# Patient Record
Sex: Male | Born: 1980 | Race: Black or African American | Hispanic: No | Marital: Single | Smoking: Current every day smoker
Health system: Southern US, Community
[De-identification: ages and names within clinical notes are randomized; demographics above are authoritative.]

## PROBLEM LIST (undated history)

## (undated) DIAGNOSIS — R569 Unspecified convulsions: Secondary | ICD-10-CM

## (undated) DIAGNOSIS — I10 Essential (primary) hypertension: Secondary | ICD-10-CM

## (undated) HISTORY — PX: OTHER SURGICAL HISTORY: SHX169

---

## 2004-01-20 ENCOUNTER — Ambulatory Visit: Payer: Self-pay | Admitting: Physician Assistant

## 2004-04-01 ENCOUNTER — Ambulatory Visit: Payer: Self-pay | Admitting: Physician Assistant

## 2012-09-22 ENCOUNTER — Emergency Department: Payer: Self-pay | Admitting: Emergency Medicine

## 2012-09-22 LAB — BASIC METABOLIC PANEL
Anion Gap: 11 (ref 7–16)
BUN: 13 mg/dL (ref 7–18)
Calcium, Total: 9.2 mg/dL (ref 8.5–10.1)
Chloride: 110 mmol/L — ABNORMAL HIGH (ref 98–107)
Co2: 21 mmol/L (ref 21–32)
Creatinine: 1.15 mg/dL (ref 0.60–1.30)
EGFR (African American): >60
EGFR (Non-African Amer.): >60
Glucose: 91 mg/dL (ref 65–99)
Osmolality: 283 (ref 275–301)
Potassium: 3.9 mmol/L (ref 3.5–5.1)
Sodium: 142 mmol/L (ref 136–145)

## 2012-09-22 LAB — CBC
HCT: 49.1 % (ref 40.0–52.0)
HGB: 16.4 g/dL (ref 13.0–18.0)
MCH: 29.4 pg (ref 26.0–34.0)
MCHC: 33.3 g/dL (ref 32.0–36.0)
MCV: 88 fL (ref 80–100)
Platelet: 322 10*3/uL (ref 150–440)
WBC: 8 10*3/uL (ref 3.8–10.6)

## 2012-09-22 LAB — CK TOTAL AND CKMB (NOT AT ARMC)
CK, Total: 425 U/L — ABNORMAL HIGH (ref 35–232)
CK-MB: 1.8 ng/mL (ref 0.5–3.6)

## 2012-09-22 LAB — TROPONIN I: Troponin-I: <0.02 ng/mL

## 2015-06-16 ENCOUNTER — Emergency Department: Payer: Medicare Other

## 2015-06-16 ENCOUNTER — Encounter: Payer: Self-pay | Admitting: *Deleted

## 2015-06-16 ENCOUNTER — Ambulatory Visit
Admission: EM | Admit: 2015-06-16 | Discharge: 2015-06-16 | Discharge status: Absent without leave | Payer: Medicaid Other

## 2015-06-16 ENCOUNTER — Emergency Department
Admission: EM | Admit: 2015-06-16 | Discharge: 2015-06-16 | Disposition: A | Discharge status: Home / Self Care | Payer: Medicare Other | Attending: Emergency Medicine | Admitting: Emergency Medicine

## 2015-06-16 DIAGNOSIS — Z955 Presence of coronary angioplasty implant and graft: Secondary | ICD-10-CM | POA: Insufficient documentation

## 2015-06-16 DIAGNOSIS — I10 Essential (primary) hypertension: Secondary | ICD-10-CM | POA: Insufficient documentation

## 2015-06-16 DIAGNOSIS — R569 Unspecified convulsions: Secondary | ICD-10-CM | POA: Diagnosis not present

## 2015-06-16 DIAGNOSIS — Y929 Unspecified place or not applicable: Secondary | ICD-10-CM | POA: Insufficient documentation

## 2015-06-16 DIAGNOSIS — Z23 Encounter for immunization: Secondary | ICD-10-CM | POA: Diagnosis not present

## 2015-06-16 DIAGNOSIS — W28XXXA Contact with powered lawn mower, initial encounter: Secondary | ICD-10-CM | POA: Insufficient documentation

## 2015-06-16 DIAGNOSIS — F17200 Nicotine dependence, unspecified, uncomplicated: Secondary | ICD-10-CM | POA: Diagnosis not present

## 2015-06-16 DIAGNOSIS — S61219A Laceration without foreign body of unspecified finger without damage to nail, initial encounter: Secondary | ICD-10-CM

## 2015-06-16 DIAGNOSIS — S62639B Displaced fracture of distal phalanx of unspecified finger, initial encounter for open fracture: Secondary | ICD-10-CM

## 2015-06-16 DIAGNOSIS — Y9389 Activity, other specified: Secondary | ICD-10-CM | POA: Diagnosis not present

## 2015-06-16 DIAGNOSIS — S61215A Laceration without foreign body of left ring finger without damage to nail, initial encounter: Secondary | ICD-10-CM | POA: Diagnosis present

## 2015-06-16 DIAGNOSIS — Y999 Unspecified external cause status: Secondary | ICD-10-CM | POA: Diagnosis not present

## 2015-06-16 DIAGNOSIS — S62635B Displaced fracture of distal phalanx of left ring finger, initial encounter for open fracture: Secondary | ICD-10-CM | POA: Insufficient documentation

## 2015-06-16 HISTORY — DX: Essential (primary) hypertension: I10

## 2015-06-16 HISTORY — DX: Unspecified convulsions: R56.9

## 2015-06-16 MED ORDER — BUPIVACAINE HCL 0.5 % IJ SOLN
50.0000 mL | Freq: Once | INTRAMUSCULAR | Status: DC
  Filled 2015-06-16: qty 50

## 2015-06-16 MED ORDER — TETANUS-DIPHTH-ACELL PERTUSSIS 5-2.5-18.5 LF-MCG/0.5 IM SUSP
0.5000 mL | Freq: Once | INTRAMUSCULAR | Status: AC
  Administered 2015-06-16: 0.5 mL via INTRAMUSCULAR
  Filled 2015-06-16: qty 0.5

## 2015-06-16 MED ORDER — OXYCODONE-ACETAMINOPHEN 5-325 MG PO TABS: 1.0000 | ORAL_TABLET | Freq: Four times a day (QID) | ORAL | Status: DC | PRN

## 2015-06-16 MED ORDER — LIDOCAINE HCL (PF) 1 % IJ SOLN
INTRAMUSCULAR | Status: AC
  Filled 2015-06-16: qty 5

## 2015-06-16 MED ORDER — BUPIVACAINE HCL (PF) 0.5 % IJ SOLN
INTRAMUSCULAR | Status: AC
  Filled 2015-06-16: qty 30

## 2015-06-16 MED ORDER — SULFAMETHOXAZOLE-TRIMETHOPRIM 800-160 MG PO TABS: 1.0000 | ORAL_TABLET | Freq: Two times a day (BID) | ORAL | Status: DC

## 2015-06-16 MED ORDER — OXYCODONE-ACETAMINOPHEN 5-325 MG PO TABS
1.0000 | ORAL_TABLET | Freq: Once | ORAL | Status: AC
  Administered 2015-06-16: 1 via ORAL
  Filled 2015-06-16: qty 1

## 2015-06-16 MED ORDER — CEPHALEXIN 500 MG PO CAPS: 500.0000 mg | ORAL_CAPSULE | Freq: Two times a day (BID) | ORAL | Status: AC

## 2015-06-16 MED ORDER — BACITRACIN ZINC 500 UNIT/GM EX OINT
1.0000 "application " | TOPICAL_OINTMENT | Freq: Two times a day (BID) | CUTANEOUS | Status: DC
  Administered 2015-06-16: 1 via TOPICAL
  Filled 2015-06-16: qty 0.9

## 2015-06-16 MED ORDER — LIDOCAINE HCL (PF) 1 % IJ SOLN
5.0000 mL | Freq: Once | INTRAMUSCULAR | Status: DC
Start: 2015-06-16 — End: 2015-06-16
  Filled 2015-06-16: qty 5

## 2015-06-16 NOTE — ED Provider Notes (Signed)
[facility] Emergency Department Provider Note ____________________________________________  Time seen: Approximately 3:40 PM  I have reviewed the triage vital signs and the nursing notes.   HISTORY  Chief Complaint Laceration   HPI Jacob Bell. is a 35 y.o. male who presents to the emergency department for evaluation of a hand injury. He states his hand slipped under his lawn mower and cut his 3rd and 4th fingertip just prior to arrival. Bleeding well controlled at this time. Unsure of last tetanus shot.    Past Medical History  Diagnosis Date  . Seizures (HCC)   . Hypertension     There are no active problems to display for this patient.   Past Surgical History  Procedure Laterality Date  . Stent coronary      Current Outpatient Rx  Name  Route  Sig  Dispense  Refill  . cephALEXin (KEFLEX) 500 MG capsule   Oral   Take 1 capsule (500 mg total) by mouth 2 (two) times daily.   40 capsule   0   . oxyCODONE-acetaminophen (ROXICET) 5-325 MG tablet   Oral   Take 1 tablet by mouth every 6 (six) hours as needed.   20 tablet   0   . sulfamethoxazole-trimethoprim (BACTRIM DS,SEPTRA DS) 800-160 MG tablet   Oral   Take 1 tablet by mouth 2 (two) times daily.   20 tablet   0     Allergies Review of patient's allergies indicates no known allergies.  History reviewed. No pertinent family history.  Social History Social History  Substance Use Topics  . Smoking status: Current Every Day Smoker  . Smokeless tobacco: None  . Alcohol Use: No    Review of Systems  Constitutional: No recent illness Musculoskeletal: Positive for pain in the 3rd and 4th digits of left hand. Skin: Positive for laceration to 4th finger and abrasion on 3rd. Neurological: Negative for focal weakness or numbness. ____________________________________________   PHYSICAL EXAM:  VITAL SIGNS: ED Triage Vitals  Enc Vitals Group     BP 06/16/15 1454 137/88  mmHg     Pulse Rate 06/16/15 1454 79     Resp 06/16/15 1454 15     Temp 06/16/15 1454 97.7 F (36.5 C)     Temp Source 06/16/15 1454 Oral     SpO2 06/16/15 1454 97 %     Weight 06/16/15 1454 230 lb (104.327 kg)     Height 06/16/15 1454  (1.88 m)     Head Cir --      Peak Flow --      Pain Score 06/16/15 1452 10     Pain Loc --      Pain Edu? --      Excl. in GC? --     Constitutional: Alert and oriented. Well appearing and in no acute distress. Head: Atraumatic. Mouth/Throat: Mucous membranes are moist.   Cardiovascular: Normal rate, regular rhythm.  Good peripheral circulation. Respiratory: Normal respiratory effort.  No retractions. Musculoskeletal: Active ROM of all fingers on the left hand. Movement of DIP of 4th digit of left hand against resistance 5+.   Neurologic:  Normal speech and language. No gross focal neurologic deficits are appreciated. Speech is normal. No gait instability. 2 point discrimination of left hand, 4th digit is intact.  Skin:  Gaping laceration to the fingertip of the 4th digit on the left hand; abrasion to the 3rd digit of the left hand; Negative for petechiae.  Psychiatric: Mood and  affect are normal. Speech and behavior are normal.  ____________________________________________   LABS (all labs ordered are listed, but only abnormal results are displayed)  Labs Reviewed - No data to display ____________________________________________  EKG   ____________________________________________  RADIOLOGY  Mildly displaced tuft fracture of the left fourth distal phalanx. I, Kem Boroughsari Yasamin Karel, personally viewed and evaluated these images (plain radiographs) as part of my medical decision making, as well as reviewing the written report by the radiologist.  ____________________________________________   PROCEDURES  Procedure(s) performed:  LACERATION REPAIR Performed by: Kem Boroughsari Amiree No Authorized by: Kem Boroughsari Mitchell Antolin Consent: Verbal consent  obtained. Risks and benefits: risks, benefits and alternatives were discussed Consent given by: patient Patient identity confirmed: provided demographic data Prepped and Draped in normal sterile fashion Wound explored  Laceration Location: Left hand, ring fingertip  Laceration Length: 4cm  No Foreign Bodies seen or palpated  Anesthesia: local infiltration  Local anesthetic: lidocaine 1% with marcaine 0.5  Anesthetic total: 7 ml  Irrigation method: syringe  Amount of cleaning: Copious  Skin closure: 4-0 vicryl; 5-0 Nylon  Number of sutures: 2/8  Technique: simple interrupted  Patient tolerance: Patient tolerated the procedure well with no immediate complications.  SPLINT APPLICATION Authorized by: Kem Boroughsari Deundra Bard Consent: Verbal consent obtained. Risks and benefits: risks, benefits and alternatives were discussed Consent given by: patient Splint applied by: Wille CelesteJanie, RN Location details: left hand, ring finger Splint type: aluminum foam Supplies used: aluminum foam prefabricated splint Post-procedure: The splinted body part was neurovascularly unchanged following the procedure. Patient tolerance: Patient tolerated the procedure well with no immediate complications.     ____________________________________________   INITIAL IMPRESSION / ASSESSMENT AND PLAN / ED COURSE  Pertinent labs & imaging results that were available during my care of the patient were reviewed by me and considered in my medical decision making (see chart for details).  Patient is to call to schedule a follow up with orthopedics. He was given strict wound care instructions. He will be given prescriptions for bactrim, keflex, and percocet. He was given written and verbal warnings regarding infection. Signs and symptoms of infection were reviewed with the patient. He is instructed to follow-up with his primary care provider, orthopedics, or the ER if he has any concerns of  infection. ____________________________________________   FINAL CLINICAL IMPRESSION(S) / ED DIAGNOSES  Final diagnoses:  Open fracture of tuft of distal phalanx of finger, initial encounter  Laceration of finger of left hand, initial encounter       Chinita PesterCari B Green [place], FNP 06/16/15 1826  Maurilio LovelyNoelle McLaurin, MD 06/16/15 2342

## 2015-06-16 NOTE — ED Notes (Signed)
States his hand slipped under the lawn mower blade and now las laceration to left 4th digit, bleeding controlled at this time

## 2015-06-16 NOTE — Discharge Instructions (Signed)
Finger Fracture  Fractures of fingers are breaks in the bones of the fingers. There are many types of fractures. There are different ways of treating these fractures. Your health care provider will discuss the best way to treat your fracture.  CAUSES  Traumatic injury is the main cause of broken fingers. These include:  · Injuries while playing sports.  · Workplace injuries.  · Falls.  RISK FACTORS  Activities that can increase your risk of finger fractures include:  · Sports.  · Workplace activities that involve machinery.  · A condition called osteoporosis, which can make your bones less dense and cause them to fracture more easily.  SIGNS AND SYMPTOMS  The main symptoms of a broken finger are pain and swelling within 15 minutes after the injury. Other symptoms include:  · Bruising of your finger.  · Stiffness of your finger.  · Numbness of your finger.  · Exposed bones (compound fracture) if the fracture is severe.  DIAGNOSIS   The best way to diagnose a broken bone is with X-ray imaging. Additionally, your health care provider will use this X-ray image to evaluate the position of the broken finger bones.   TREATMENT   Finger fractures can be treated with:   · Nonreduction--This means the bones are in place. The finger is splinted without changing the positions of the bone pieces. The splint is usually left on for about a week to 10 days. This will depend on your fracture and what your health care provider thinks.  · Closed reduction--The bones are put back into position without using surgery. The finger is then splinted.  · Open reduction and internal fixation--The fracture site is opened. Then the bone pieces are fixed into place with pins or some type of hardware. This is seldom required. It depends on the severity of the fracture.  HOME CARE INSTRUCTIONS   · Follow your health care provider's instructions regarding activities, exercises, and physical therapy.  · Only take over-the-counter or prescription  medicines for pain, discomfort, or fever as directed by your health care provider.  SEEK MEDICAL CARE IF:  You have pain or swelling that limits the motion or use of your fingers.  SEEK IMMEDIATE MEDICAL CARE IF:   Your finger becomes numb.  MAKE SURE YOU:   · Understand these instructions.  · Will watch your condition.  · Will get help right away if you are not doing well or get worse.     This information is not intended to replace advice given to you by your health care provider. Make sure you discuss any questions you have with your health care provider.     Document Released: 06/18/2000 Document Revised: 12/25/2012 Document Reviewed: 10/16/2012  Elsevier Interactive Patient Education ©2016 [employer]

## 2017-11-21 ENCOUNTER — Ambulatory Visit
Admission: EM | Admit: 2017-11-21 | Discharge: 2017-11-21 | Disposition: A | Discharge status: Home / Self Care | Payer: Medicare Other | Attending: Family Medicine | Admitting: Family Medicine

## 2017-11-21 ENCOUNTER — Other Ambulatory Visit: Payer: Self-pay

## 2017-11-21 ENCOUNTER — Ambulatory Visit: Payer: Medicare Other

## 2017-11-21 DIAGNOSIS — S638X2A Sprain of other part of left wrist and hand, initial encounter: Secondary | ICD-10-CM | POA: Diagnosis not present

## 2017-11-21 DIAGNOSIS — X500XXA Overexertion from strenuous movement or load, initial encounter: Secondary | ICD-10-CM | POA: Insufficient documentation

## 2017-11-21 DIAGNOSIS — E785 Hyperlipidemia, unspecified: Secondary | ICD-10-CM | POA: Insufficient documentation

## 2017-11-21 DIAGNOSIS — S6392XA Sprain of unspecified part of left wrist and hand, initial encounter: Secondary | ICD-10-CM | POA: Diagnosis not present

## 2017-11-21 DIAGNOSIS — Z79899 Other long term (current) drug therapy: Secondary | ICD-10-CM | POA: Insufficient documentation

## 2017-11-21 DIAGNOSIS — Z7982 Long term (current) use of aspirin: Secondary | ICD-10-CM | POA: Diagnosis not present

## 2017-11-21 DIAGNOSIS — K219 Gastro-esophageal reflux disease without esophagitis: Secondary | ICD-10-CM | POA: Diagnosis not present

## 2017-11-21 DIAGNOSIS — I10 Essential (primary) hypertension: Secondary | ICD-10-CM | POA: Diagnosis not present

## 2017-11-21 DIAGNOSIS — Z7902 Long term (current) use of antithrombotics/antiplatelets: Secondary | ICD-10-CM | POA: Diagnosis not present

## 2017-11-21 DIAGNOSIS — M79642 Pain in left hand: Secondary | ICD-10-CM | POA: Diagnosis present

## 2017-11-21 DIAGNOSIS — I252 Old myocardial infarction: Secondary | ICD-10-CM | POA: Diagnosis not present

## 2017-11-21 DIAGNOSIS — F17210 Nicotine dependence, cigarettes, uncomplicated: Secondary | ICD-10-CM | POA: Insufficient documentation

## 2017-11-21 DIAGNOSIS — I2510 Atherosclerotic heart disease of native coronary artery without angina pectoris: Secondary | ICD-10-CM | POA: Diagnosis not present

## 2017-11-21 MED ORDER — NAPROXEN 500 MG PO TABS: 500.0000 mg | ORAL_TABLET | Freq: Two times a day (BID) | ORAL | 0 refills | Status: AC | PRN

## 2017-11-21 NOTE — ED Triage Notes (Signed)
Patient complains of left hand pain. States that he was pulling something and heard a pop. Patient states that hand started to swell and has been hurting since.

## 2017-11-21 NOTE — ED Provider Notes (Signed)
[facility]    CSN: [id number] Arrival date & time: 11/21/17  1358  History   Chief Complaint Chief Complaint  Patient presents with  . Hand Pain    left   HPI   37 year old male with a history of coronary artery disease presents with hand pain.  Patient states that he was pulling a cart/dolly that was carrying a lot of weight.  In doing so, he injured his left hand.  He states that he felt a pop.  His pain is located at the thenar eminence and around the thumb.  No reports of wrist pain.  No other locations of hand pain.  Worse with activity.  No relieving factors.  Patient thought it would be best to come in for evaluation to ensure no fracture.  No other associated symptoms.  No other complaints.  PMH: Seizures (CMS-HCC)    MI (myocardial infarction) (CMS-HCC)    Hypertension    Hyperlipidemia    GERD (gastroesophageal reflux disease)     Surgical Hx: BILIARY STENT PLACEMENT [facility] [address] HISTORICAL RESULT)      FRACTURE SURGERY      BRAIN SURGERY 11/29/2000 Left Debridement of skull fracture with dural repair and cranioplasty     Home Medications    Prior to Admission medications   Medication Sig Start Date End Date Taking? Authorizing Provider  aspirin EC 81 MG tablet Take by mouth.   Yes [provider]  clopidogrel (PLAVIX) 75 MG tablet Take 75 mg by mouth daily. 09/28/17  Yes [provider]  ezetimibe (ZETIA) 10 MG tablet Take by mouth. 10/18/17 10/18/18 Yes [provider]  levETIRAcetam (KEPPRA) 500 MG tablet Take 1,500 mg by mouth 2 (two) times daily. 10/27/17  Yes [provider]  lisinopril (PRINIVIL,ZESTRIL) 5 MG tablet Take 5 mg by mouth daily. 10/11/17  Yes [provider]  metoprolol tartrate (LOPRESSOR) 25 MG tablet Take 25 mg by mouth 2 (two) times daily. 09/28/17  Yes [provider]  tiZANidine (ZANAFLEX) 2 MG tablet Take by mouth. 01/25/17  Yes [provider]   Family  History Family History  Problem Relation Age of Onset  . Hypertension Mother   . Cancer Father     Social History Social History   Tobacco Use  . Smoking status: Current Every Day Smoker    Packs/day: 0.25  . Smokeless tobacco: Never Used  Substance Use Topics  . Alcohol use: Yes    Comment: occasionally  . Drug use: Yes    Types: Marijuana     Allergies   Methadone   Review of Systems Review of Systems  Constitutional: Negative.   Musculoskeletal:       Hand pain - left.   Physical Exam Triage Vital Signs ED Triage Vitals  Enc Vitals Group     BP 11/21/17 1426 125/85     Pulse Rate 11/21/17 1426 78     Resp 11/21/17 1426 18     Temp 11/21/17 1426 98.3 F (36.8 C)     Temp Source 11/21/17 1426 Oral     SpO2 11/21/17 1426 96 %     Weight 11/21/17 1425 225 lb (102.1 kg)     Height 11/21/17 1425 6' 2.5" (1.892 m)     Head Circumference --      Peak Flow --      Pain Score 11/21/17 1425 10     Pain Loc --      Pain Edu? --  Excl. in GC? --    Updated Vital Signs BP 125/85 (BP Location: Left Arm)   Pulse 78   Temp 98.3 F (36.8 C) (Oral)   Resp 18   Ht 6' 2.5" (1.892 m)   Wt 102.1 kg   SpO2 96%   BMI 28.50 kg/m   Visual Acuity Right Eye Distance:   Left Eye Distance:   Bilateral Distance:    Right Eye Near:   Left Eye Near:    Bilateral Near:     Physical Exam  Constitutional: He is oriented to person, place, and time. He appears well-developed. No distress.  HENT:  Head: Normocephalic and atraumatic.  Cardiovascular: Normal rate and regular rhythm.  Pulmonary/Chest: Effort normal. No respiratory distress.  Musculoskeletal:  Left hand - mild tenderness and swelling of the thenar eminence.  Neurological: He is alert and oriented to person, place, and time.  Psychiatric: He has a normal mood and affect. His behavior is normal.  Nursing note and vitals reviewed.  UC Treatments / Results  Labs (all labs ordered are listed, but only  abnormal results are displayed) Labs Reviewed - No data to display  EKG None  Radiology Dg Hand Complete Left  Result Date: 11/21/2017 CLINICAL DATA:  Left hand pain. EXAM: LEFT HAND - COMPLETE 3+ VIEW COMPARISON:  06/16/2015 FINDINGS: There is no evidence of fracture or dislocation. There is no evidence of arthropathy or other focal bone abnormality. Soft tissues are unremarkable. IMPRESSION: Negative. Electronically Signed   By: Ted Mcalpine M.D.   On: 11/21/2017 15:04    Procedures Procedures (including critical care time)  Medications Ordered in UC Medications - No data to display  Initial Impression / Assessment and Plan / UC Course  I have reviewed the triage vital signs and the nursing notes.  Pertinent labs & imaging results that were available during my care of the patient were reviewed by me and considered in my medical decision making (see chart for details).    38 year old male presents with a hand injury.  X-ray negative.  Rest, ice, elevation.  Brief use of naproxen given cardiac history.  Final Clinical Impressions(s) / UC Diagnoses   Final diagnoses:  Hand sprain, left, initial encounter     Discharge Instructions     Rest, ice, elevation.  Use the medication as needed.  Take care  Dr Adriana Simas    ED Prescriptions    Medication Sig Dispense Auth. Provider   naproxen (NAPROSYN) 500 MG tablet Take 1 tablet (500 mg total) by mouth 2 (two) times daily as needed. 14 tablet Tommie Sams, DO     Controlled Substance Prescriptions [place] Controlled Substance Registry consulted? Not Applicable   Tommie Sams, DO 11/21/17 1553

## 2017-11-21 NOTE — Discharge Instructions (Signed)
Rest, ice, elevation. ° °Use the medication as needed. ° °Take care ° °Dr. Amarea Macdowell  °

## 2019-07-05 IMAGING — CR DG HAND COMPLETE 3+V*L*
3 series · 3 of 3 positions shown · non-contrast
Comparison: 06/16/2015

CLINICAL DATA: Left hand pain.

EXAM:
LEFT HAND - COMPLETE 3+ VIEW

[hand ap]
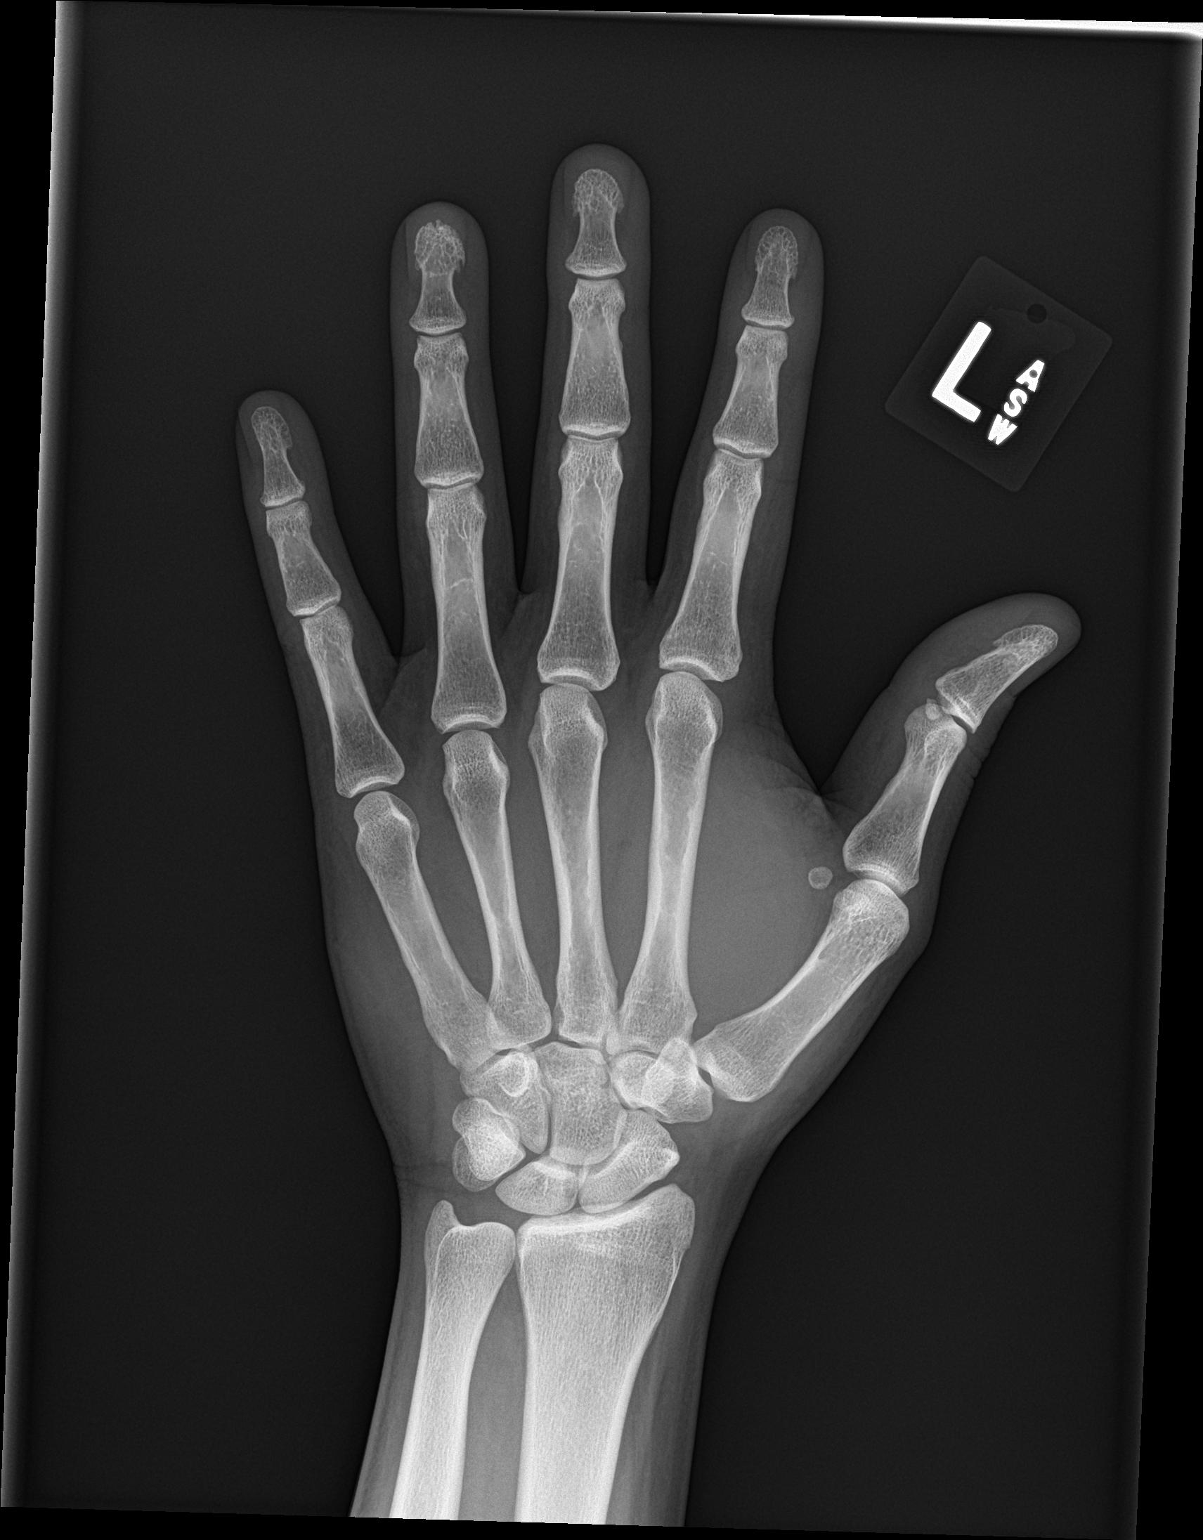

[hand obl]
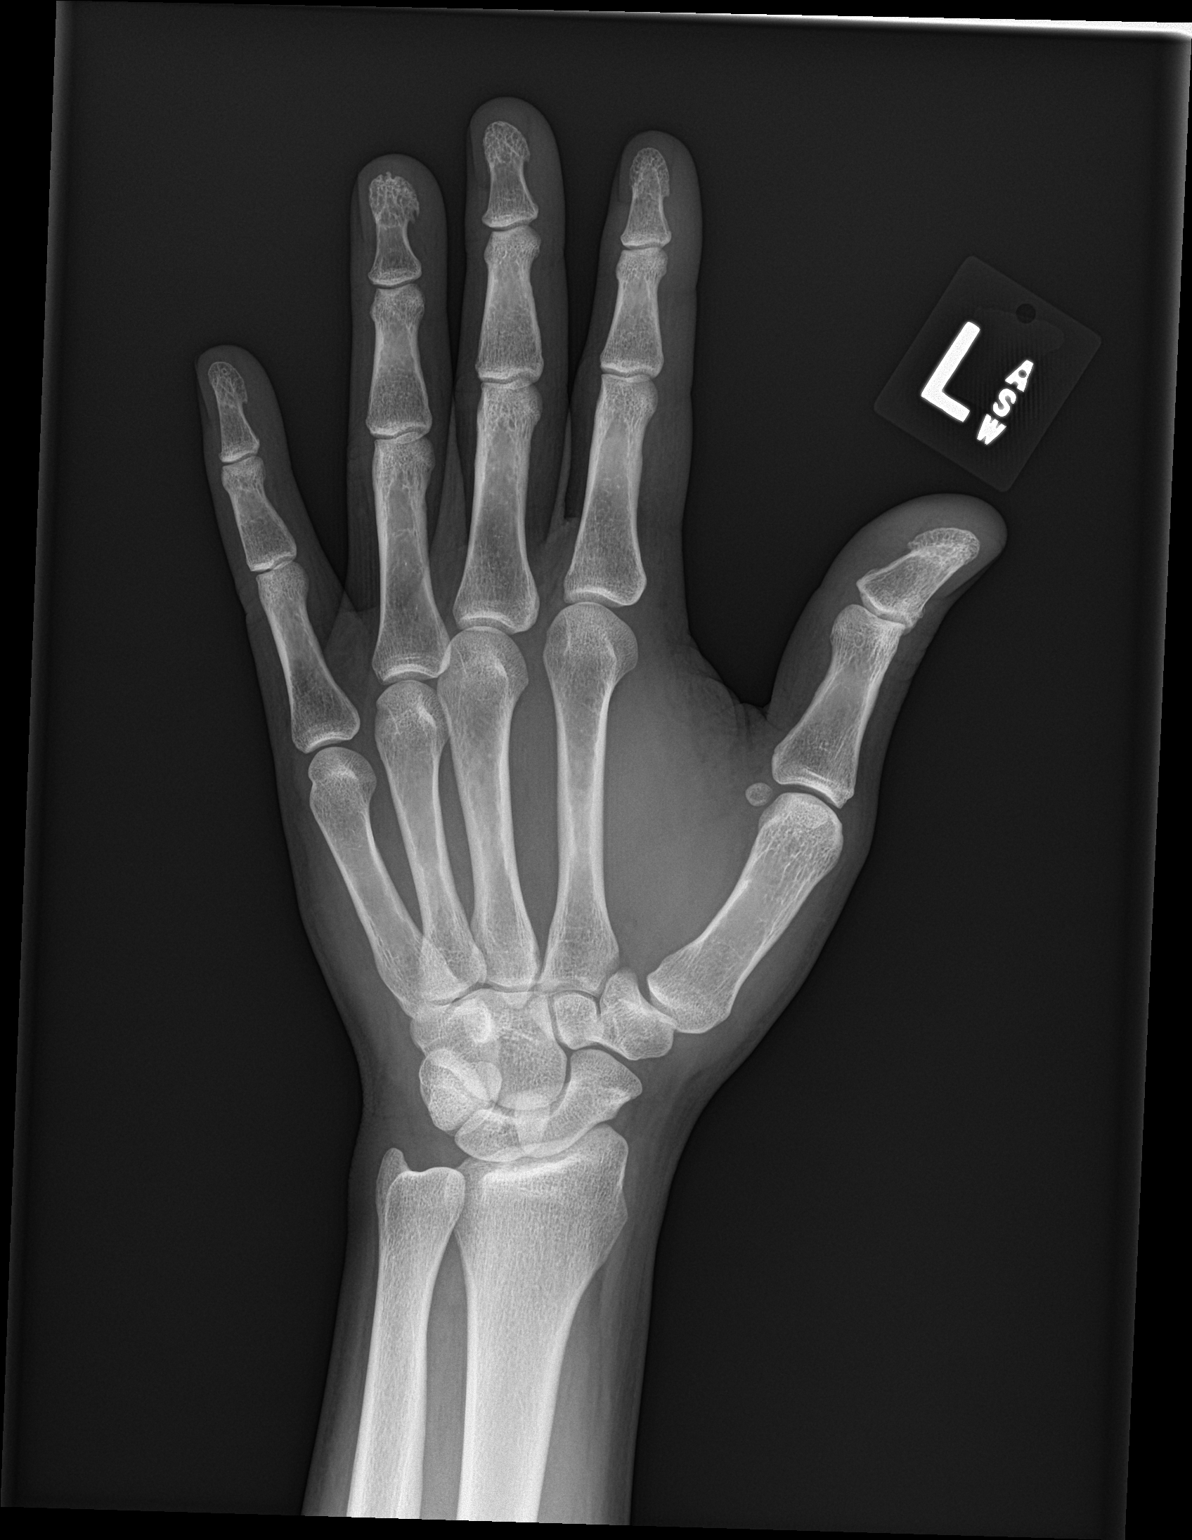

[hand lat]
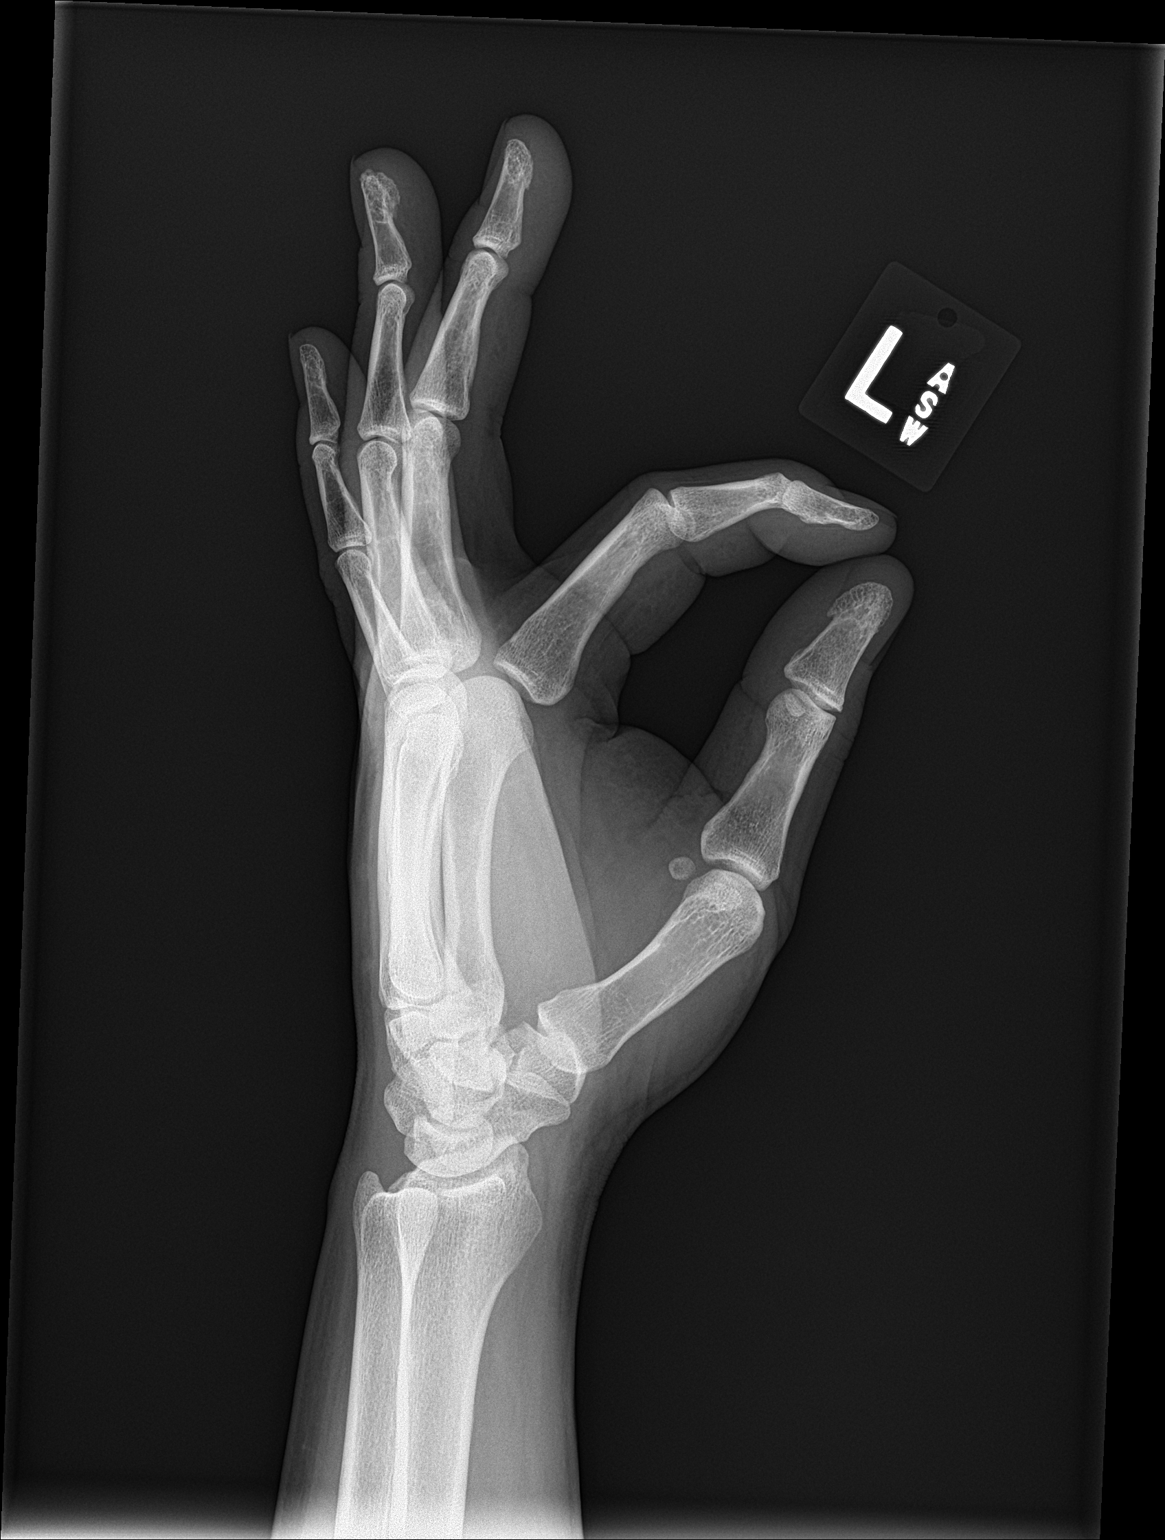

[3 of 3 positions shown; findings below may reference images not displayed]

FINDINGS: There is no evidence of fracture or dislocation. There is no
evidence of arthropathy or other focal bone abnormality. Soft
tissues are unremarkable.
IMPRESSION: Negative.
